# Patient Record
Sex: Female | Born: 1985 | Race: White | Hispanic: No | Smoking: Never smoker
Health system: Southern US, Community
[De-identification: ages and names within clinical notes are randomized; demographics above are authoritative.]

## PROBLEM LIST (undated history)

## (undated) DIAGNOSIS — J45909 Unspecified asthma, uncomplicated: Secondary | ICD-10-CM

## (undated) HISTORY — PX: APPENDECTOMY: SHX54

---

## 2009-02-18 ENCOUNTER — Ambulatory Visit: Payer: Self-pay | Admitting: Family Medicine

## 2011-01-22 ENCOUNTER — Ambulatory Visit: Payer: Self-pay | Admitting: Family Medicine

## 2011-04-18 ENCOUNTER — Ambulatory Visit: Payer: Self-pay | Admitting: Surgery

## 2011-04-20 LAB — PATHOLOGY REPORT

## 2013-02-01 IMAGING — CT CT ABD-PELV W/ CM
1 of 2 series · 15 of 32 positions shown, 19 images · non-contrast
Comparison: none

REASON FOR EXAM: (1) N/V; (2) RLQ PAIN - EVAL APPENDIX;    NOTE: Nursing
to Give Oral CT Contrast
COMMENTS:   May transport without cardiac monitor

PROCEDURE:     CT  - CT ABDOMEN / PELVIS  W  - April 18, 2011  [DATE]
RESULT:
TECHNIQUE: Helical 3 mm sections were obtained from the lung base through
the pubic symphysis status post intravenous administration of 100 mL
Dsovue-BR7 and oral contrast.

[Series 2: appendicitis · axial · 0.67mm/px · z∈[-683,-302]mm · 15 of 139 slices shown, 19 images]
[im 6/139  soft-tissue]
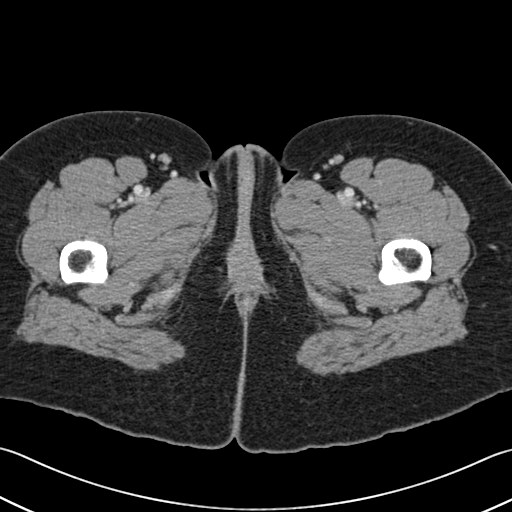
[im 6/139  bone]
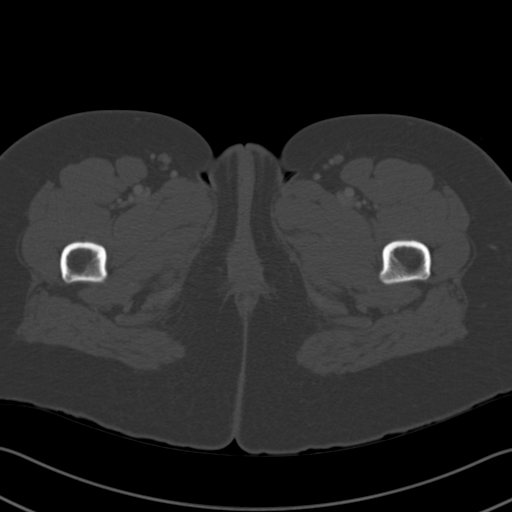
[im 17/139  soft-tissue]
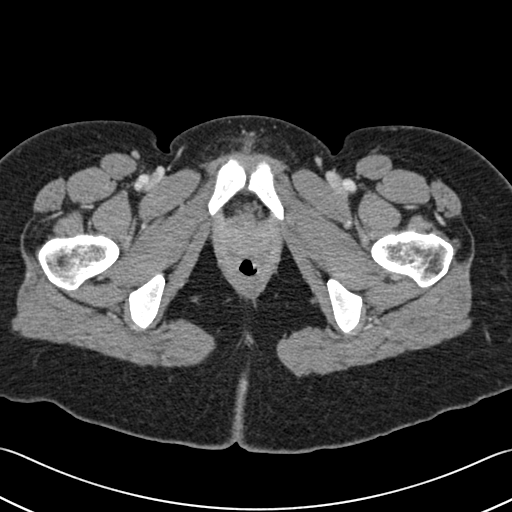
[im 28/139  soft-tissue]
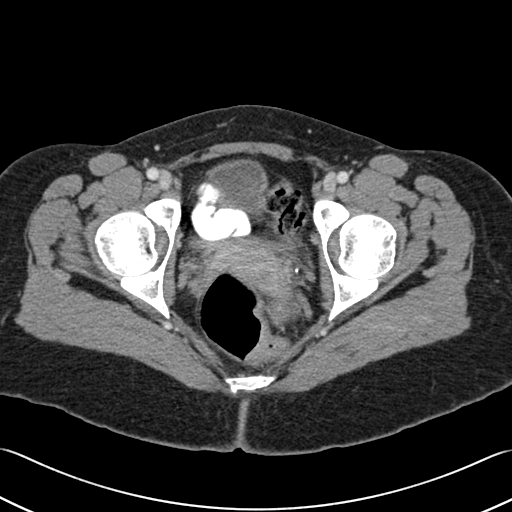
[im 39/139  soft-tissue]
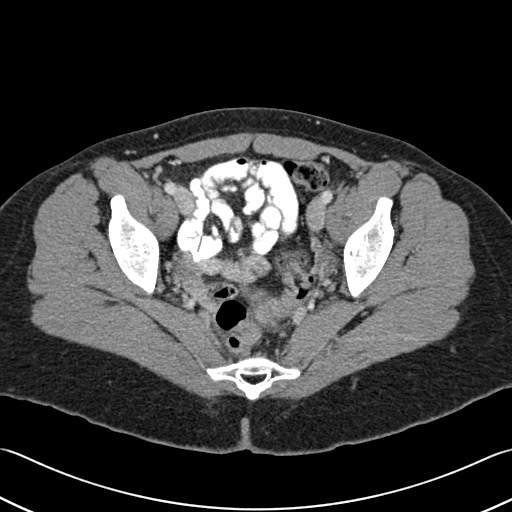
[im 50/139  soft-tissue]
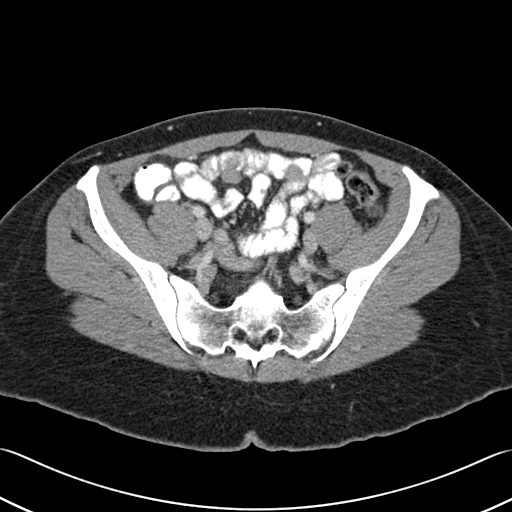
[im 61/139  soft-tissue]
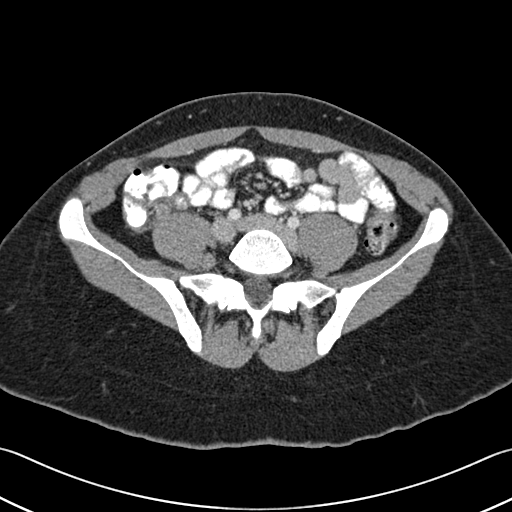
[im 72/139  soft-tissue]
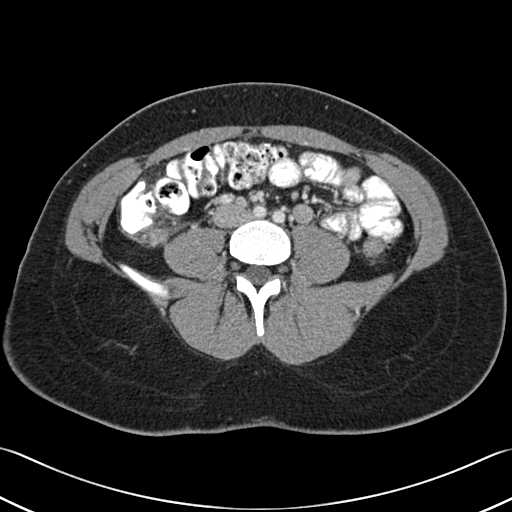
[im 78/139  soft-tissue]
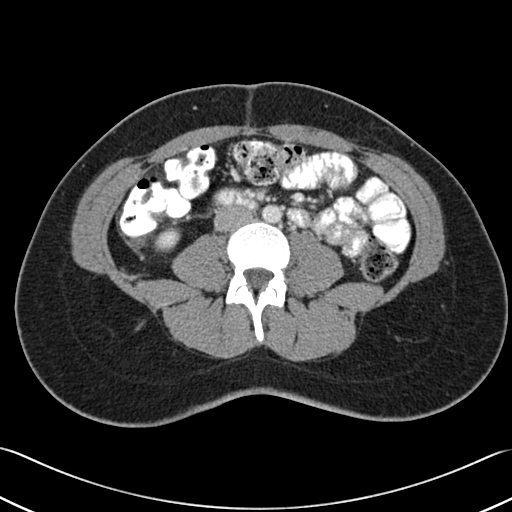
[im 89/139  soft-tissue]
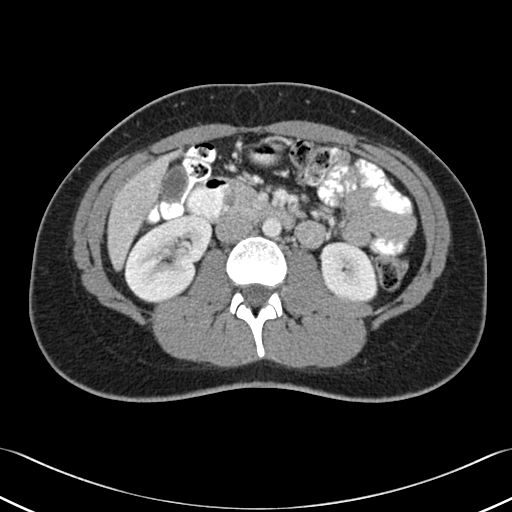
[im 89/139  bone]
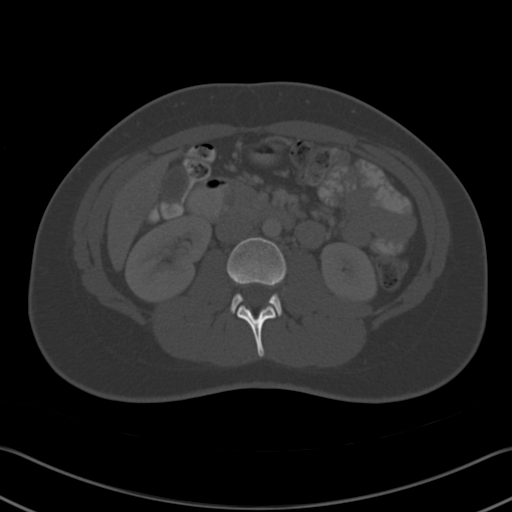
[im 100/139  soft-tissue]
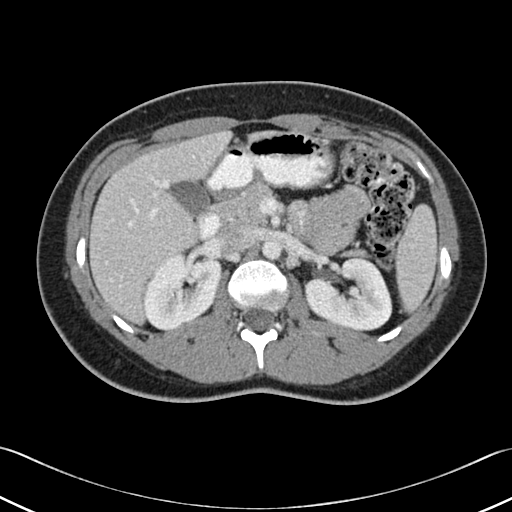
[im 111/139  soft-tissue]
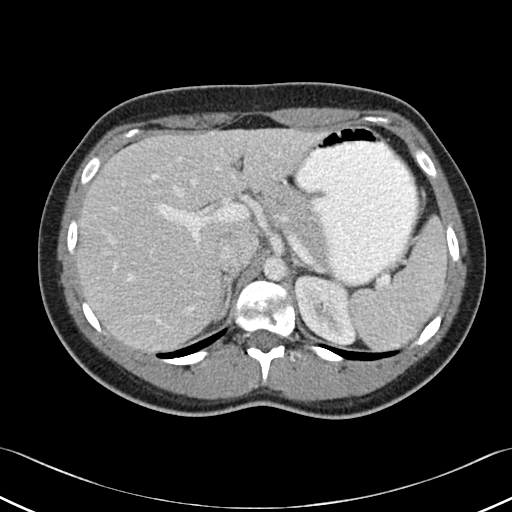
[im 116/139  lung]
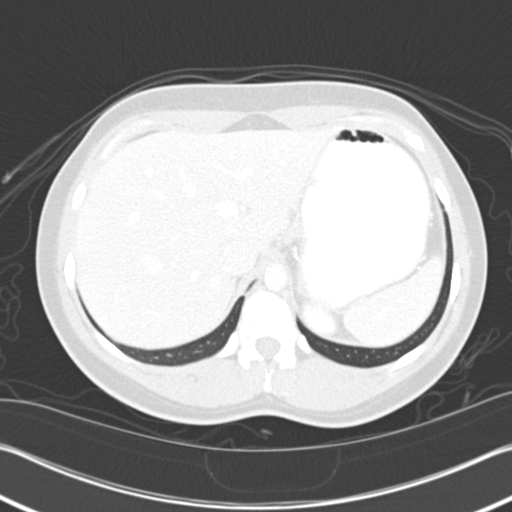
[im 122/139  soft-tissue]
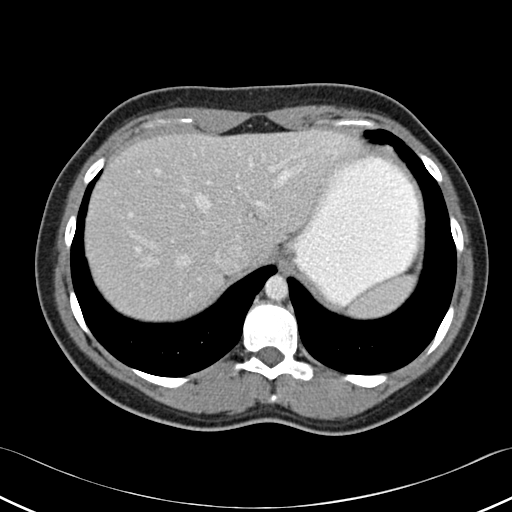
[im 122/139  lung]
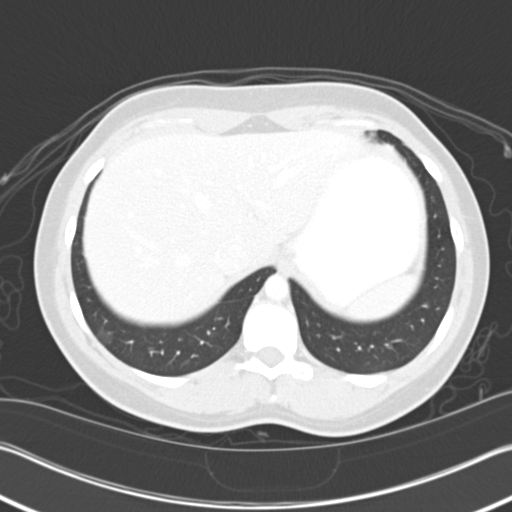
[im 127/139  lung]
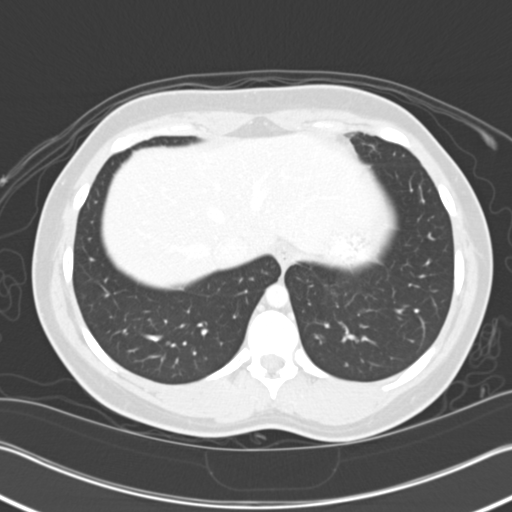
[im 133/139  soft-tissue]
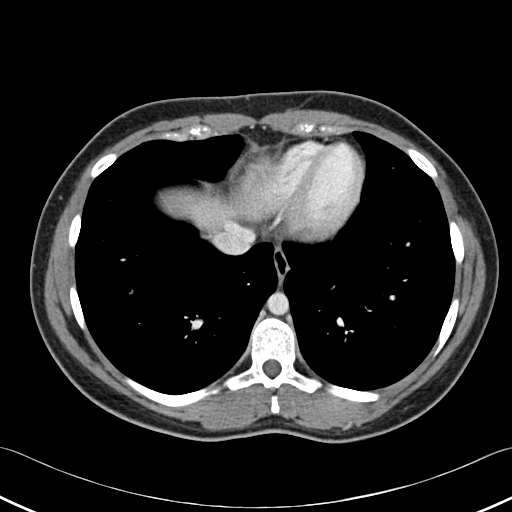
[im 133/139  lung]
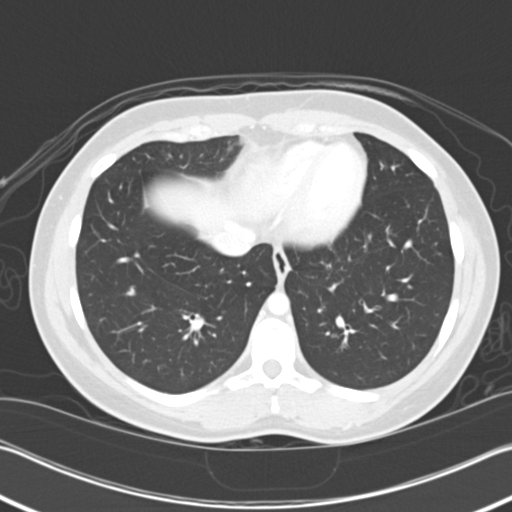

[15 of 32 positions shown; findings below may reference images not displayed]

FINDINGS: The lung bases are unremarkable.

The liver, spleen, adrenals, pancreas and kidneys are unremarkable.

The appendix is identified and is in a retrocecal location resting on the
psoas musculature. The appendix is thick-walled and distended. There are
inflammatory changes within the periappendiceal fat. The appendix measures
1.17 cm in diameter. There is no evidence of associated loculated fluid
collections consistent with an abscess or free air.

There is no evidence of bowel obstruction or evidence of an abdominal aortic
aneurysm.
IMPRESSION: 1. Findings consistent with appendicitis.
2. Dr. Hovis of the Emergency Department was informed of these findings via a
preliminary faxed report dated 04/18/2011 at [DATE] a.m. Eastern standard time.

## 2017-03-29 ENCOUNTER — Other Ambulatory Visit: Payer: Self-pay

## 2017-03-29 ENCOUNTER — Ambulatory Visit: Admission: EM | Admit: 2017-03-29 | Discharge: 2017-03-29 | Discharge status: Absent without leave | Payer: Self-pay

## 2017-03-29 ENCOUNTER — Ambulatory Visit
Admission: EM | Admit: 2017-03-29 | Discharge: 2017-03-29 | Disposition: A | Discharge status: Home / Self Care | Payer: Managed Care, Other (non HMO) | Attending: Family Medicine | Admitting: Family Medicine

## 2017-03-29 DIAGNOSIS — K529 Noninfective gastroenteritis and colitis, unspecified: Secondary | ICD-10-CM

## 2017-03-29 HISTORY — DX: Unspecified asthma, uncomplicated: J45.909

## 2017-03-29 MED ORDER — ONDANSETRON 8 MG PO TBDP: 8.0000 mg | ORAL_TABLET | Freq: Three times a day (TID) | ORAL | 0 refills | Status: AC | PRN

## 2017-03-29 MED ORDER — ONDANSETRON 8 MG PO TBDP
8.0000 mg | ORAL_TABLET | Freq: Once | ORAL | Status: AC
  Administered 2017-03-29: 8 mg via ORAL

## 2017-03-29 NOTE — ED Provider Notes (Signed)
[facility]    CSN: [id number] Arrival date & time: 03/29/17  1321     History   Chief Complaint Chief Complaint  Patient presents with  . Emesis    HPI Wanda Tran is a 31 y.o. female.   31 yo female with a c/o 2 days of intermittent vomiting and diarrhea. Denies any fevers, chills, abdominal pain, melena, hematochezia, hematemesis, dysuria. Reports positive sick contacts with similar. Denies pregnancy as she reports not being sexually active.    The history is provided by the patient.    Past Medical History:  Diagnosis Date  . Asthma     There are no active problems to display for this patient.   Past Surgical History:  Procedure Laterality Date  . APPENDECTOMY      OB History    No data available       Home Medications    Prior to Admission medications   Medication Sig Start Date End Date Taking? Authorizing Provider  albuterol (PROVENTIL HFA;VENTOLIN HFA) 108 (90 Base) MCG/ACT inhaler Inhale 2 puffs into the lungs every 6 (six) hours as needed for wheezing or shortness of breath.   Yes [provider]  fexofenadine (ALLEGRA) 180 MG tablet Take 180 mg by mouth daily.   Yes [provider]  montelukast (SINGULAIR) 10 MG tablet Take 10 mg by mouth at bedtime.   Yes [provider]  ondansetron (ZOFRAN ODT) 8 MG disintegrating tablet Take 1 tablet (8 mg total) by mouth every 8 (eight) hours as needed. 03/29/17   [name], Maycol Hoying, MD    Family History Family History  Problem Relation Age of Onset  . Asthma Father   . Allergic rhinitis Father     Social History Social History   Tobacco Use  . Smoking status: Never Smoker  . Smokeless tobacco: Never Used  Substance Use Topics  . Alcohol use: Yes    Comment: occasionally  . Drug use: No     Allergies   Benadryl [diphenhydramine hcl]   Review of Systems Review of Systems   Physical Exam Triage Vital Signs ED Triage Vitals  Enc Vitals Group    BP 03/29/17 1350 114/70     Pulse Rate 03/29/17 1350 84     Resp 03/29/17 1350 18     Temp 03/29/17 1350 98 F (36.7 C)     Temp Source 03/29/17 1350 Oral     SpO2 03/29/17 1350 100 %     Weight 03/29/17 1347 170 lb (77.1 kg)     Height 03/29/17 1347 5\' 1"  (1.549 m)     Head Circumference --      Peak Flow --      Pain Score 03/29/17 1347 0     Pain Loc --      Pain Edu? --      Excl. in GC? --    No data found.  Updated Vital Signs BP 114/70 (BP Location: Left Arm)   Pulse 84   Temp 98 F (36.7 C) (Oral)   Resp 18   Ht 5\' 1"  (1.549 m)   Wt 170 lb (77.1 kg)   LMP 03/19/2017   SpO2 100%   BMI 32.12 kg/m   Visual Acuity Right Eye Distance:   Left Eye Distance:   Bilateral Distance:    Right Eye Near:   Left Eye Near:    Bilateral Near:     Physical Exam  Constitutional: She appears well-developed and well-nourished. No distress.  Abdominal: Soft. Bowel sounds are normal. She exhibits no distension and no mass. There is no tenderness. There is no rebound and no guarding.  Skin: She is not diaphoretic.  Nursing note and vitals reviewed.    UC Treatments / Results  Labs (all labs ordered are listed, but only abnormal results are displayed) Labs Reviewed - No data to display  EKG  EKG Interpretation None       Radiology No results found.  Procedures Procedures (including critical care time)  Medications Ordered in UC Medications  ondansetron (ZOFRAN-ODT) disintegrating tablet 8 mg (8 mg Oral Given 03/29/17 1422)     Initial Impression / Assessment and Plan / UC Course  I have reviewed the triage vital signs and the nursing notes.  Pertinent labs & imaging results that were available during my care of the patient were reviewed by me and considered in my medical decision making (see chart for details).       Final Clinical Impressions(s) / UC Diagnoses   Final diagnoses:  Gastroenteritis    ED Discharge Orders        Ordered     ondansetron (ZOFRAN ODT) 8 MG disintegrating tablet  Every 8 hours PRN     03/29/17 1430     1. diagnosis reviewed with patient 2. Patient given zofran 8mg  odt x 1  3.  rx as per orders above; reviewed possible side effects, interactions, risks and benefits  4. Recommend supportive treatment with clear liquid diet then advance slowly as tolerated 5. Follow-up prn if symptoms worsen or don't improve  Controlled Substance Prescriptions [place] Controlled Substance Registry consulted? Not Applicable   [name], Lijah Bourque, MD 03/29/17 1435

## 2017-03-29 NOTE — ED Triage Notes (Signed)
Patient complains of nausea, vomiting, diarrhea with headaches. Patient states that symptoms started on Sunday. Reports that she had some headaches over last week.

## 2017-04-11 ENCOUNTER — Emergency Department
Admission: EM | Admit: 2017-04-11 | Discharge: 2017-04-11 | Disposition: A | Discharge status: Home / Self Care | Payer: Managed Care, Other (non HMO) | Attending: Emergency Medicine | Admitting: Emergency Medicine

## 2017-04-11 ENCOUNTER — Encounter: Payer: Self-pay | Admitting: Emergency Medicine

## 2017-04-11 ENCOUNTER — Emergency Department: Payer: Managed Care, Other (non HMO)

## 2017-04-11 DIAGNOSIS — J45909 Unspecified asthma, uncomplicated: Secondary | ICD-10-CM | POA: Diagnosis not present

## 2017-04-11 DIAGNOSIS — Y999 Unspecified external cause status: Secondary | ICD-10-CM | POA: Insufficient documentation

## 2017-04-11 DIAGNOSIS — S99922A Unspecified injury of left foot, initial encounter: Secondary | ICD-10-CM | POA: Diagnosis present

## 2017-04-11 DIAGNOSIS — Y939 Activity, unspecified: Secondary | ICD-10-CM | POA: Insufficient documentation

## 2017-04-11 DIAGNOSIS — Y929 Unspecified place or not applicable: Secondary | ICD-10-CM | POA: Insufficient documentation

## 2017-04-11 DIAGNOSIS — X501XXA Overexertion from prolonged static or awkward postures, initial encounter: Secondary | ICD-10-CM | POA: Diagnosis not present

## 2017-04-11 DIAGNOSIS — R55 Syncope and collapse: Secondary | ICD-10-CM

## 2017-04-11 DIAGNOSIS — S93602A Unspecified sprain of left foot, initial encounter: Secondary | ICD-10-CM | POA: Diagnosis not present

## 2017-04-11 MED ORDER — IBUPROFEN 800 MG PO TABS: 800.0000 mg | ORAL_TABLET | Freq: Three times a day (TID) | ORAL | 0 refills | Status: AC | PRN

## 2017-04-11 MED ORDER — IBUPROFEN 800 MG PO TABS
800.0000 mg | ORAL_TABLET | Freq: Once | ORAL | Status: AC
  Administered 2017-04-11: 800 mg via ORAL

## 2017-04-11 MED ORDER — IBUPROFEN 800 MG PO TABS
ORAL_TABLET | ORAL | Status: AC
  Administered 2017-04-11: 800 mg via ORAL
  Filled 2017-04-11: qty 1

## 2017-04-11 NOTE — ED Provider Notes (Signed)
[facility] Emergency Department Provider Note       Time seen: ----------------------------------------- 5:34 PM on 04/11/2017 -----------------------------------------   I have reviewed the triage vital signs and the nursing notes.  HISTORY   Chief Complaint Foot Pain    HPI Wanda Tran is a 31 y.o. female with a history of asthma who presents to the ED for left foot pain.  Patient arrives via EMS from a friend's house.  She reports sitting on her foot causing it to go numb and then when she got up she felt a pop and heard a pop in her left foot.  Because of the pain she had a near syncopal event.  She is currently complaining of left foot pain although it has improved in route.  Past Medical History:  Diagnosis Date  . Asthma     There are no active problems to display for this patient.   Past Surgical History:  Procedure Laterality Date  . APPENDECTOMY      Allergies Benadryl [diphenhydramine hcl]  Social History Social History   Tobacco Use  . Smoking status: Never Smoker  . Smokeless tobacco: Never Used  Substance Use Topics  . Alcohol use: Yes    Comment: occasionally  . Drug use: No    Review of Systems Constitutional: Negative for fever. Eyes: Negative for vision changes ENT:  Negative for congestion, sore throat Cardiovascular: Negative for chest pain. Respiratory: Negative for shortness of breath. Gastrointestinal: Negative for abdominal pain, vomiting and diarrhea. Musculoskeletal: Positive for left foot pain Skin: Negative for rash. Neurological: Negative for headaches, focal weakness or numbness.  All systems negative/normal/unremarkable except as stated in the HPI  ____________________________________________   PHYSICAL EXAM:  VITAL SIGNS: ED Triage Vitals [04/11/17 1720]  Enc Vitals Group     BP 120/80     Pulse Rate 71     Resp 16     Temp 98.4 F (36.9 C)     Temp Source Oral     SpO2 99 %   Weight 170 lb (77.1 kg)     Height 5\' 1"  (1.549 m)     Head Circumference      Peak Flow      Pain Score 6     Pain Loc      Pain Edu?      Excl. in GC?    Constitutional: Alert and oriented. Well appearing and in no distress. Eyes: Conjunctivae are normal. Normal extraocular movements. Cardiovascular: Normal rate, regular rhythm. No murmurs, rubs, or gallops. Respiratory: Normal respiratory effort without tachypnea nor retractions. Breath sounds are clear and equal bilaterally. No wheezes/rales/rhonchi. Gastrointestinal: Soft and nontender. Normal bowel sounds Musculoskeletal: Minimal tenderness to the dorsum of the left foot.  Good pulses Neurologic:  Normal speech and language. No gross focal neurologic deficits are appreciated.  Skin:  Skin is warm, dry and intact. No rash noted. Psychiatric: Mood and affect are normal. Speech and behavior are normal.  ____________________________________________  EKG: Interpreted by me.  Sinus rhythm rate 85 bpm, normal PR interval, normal QRS, normal QT.  ____________________________________________  ED COURSE:  Pertinent labs & imaging results that were available during my care of the patient were reviewed by me and considered in my medical decision making (see chart for details). Patient presents for left foot pain, we will assess with imaging as indicated.   Procedures ____________________________________________   RADIOLOGY  Left foot x-rays  IMPRESSION: Negative. ____________________________________________  DIFFERENTIAL DIAGNOSIS Foot sprain, contusion, fracture, vasovagal  syncope, arrhythmia    FINAL ASSESSMENT AND PLAN  Foot sprain   Plan: Patient had presented for left foot pain and possible syncope.  She has a normal examination and does not seem to warrant further workup.  X-rays are negative.  I will encourage Ace wrap and crutches with NSAIDs as needed.   Emily FilbertWilliams, Stiles Maxcy E, MD   Note: This note was  generated in part or whole with voice recognition software. Voice recognition is usually quite accurate but there are transcription errors that can and very often do occur. I apologize for any typographical errors that were not detected and corrected.     Emily FilbertWilliams, Jordanny Waddington E, MD 04/11/17 [id number]

## 2017-04-11 NOTE — ED Triage Notes (Signed)
Pt in via [facility] from a friends house, reports sitting on foot causing it to go numb, when she got up, "I heard a pop" reports significant pain w/ possible syncopal episode.  Pt AxO/4, vitals WDL, NAD noted at this time.

## 2019-01-26 IMAGING — DX DG FOOT COMPLETE 3+V*L*
3 series · 3 of 3 positions shown · non-contrast
Comparison: None.

CLINICAL DATA: Left foot pain.  Trauma

EXAM:
LEFT FOOT - COMPLETE 3+ VIEW

[foot ap]
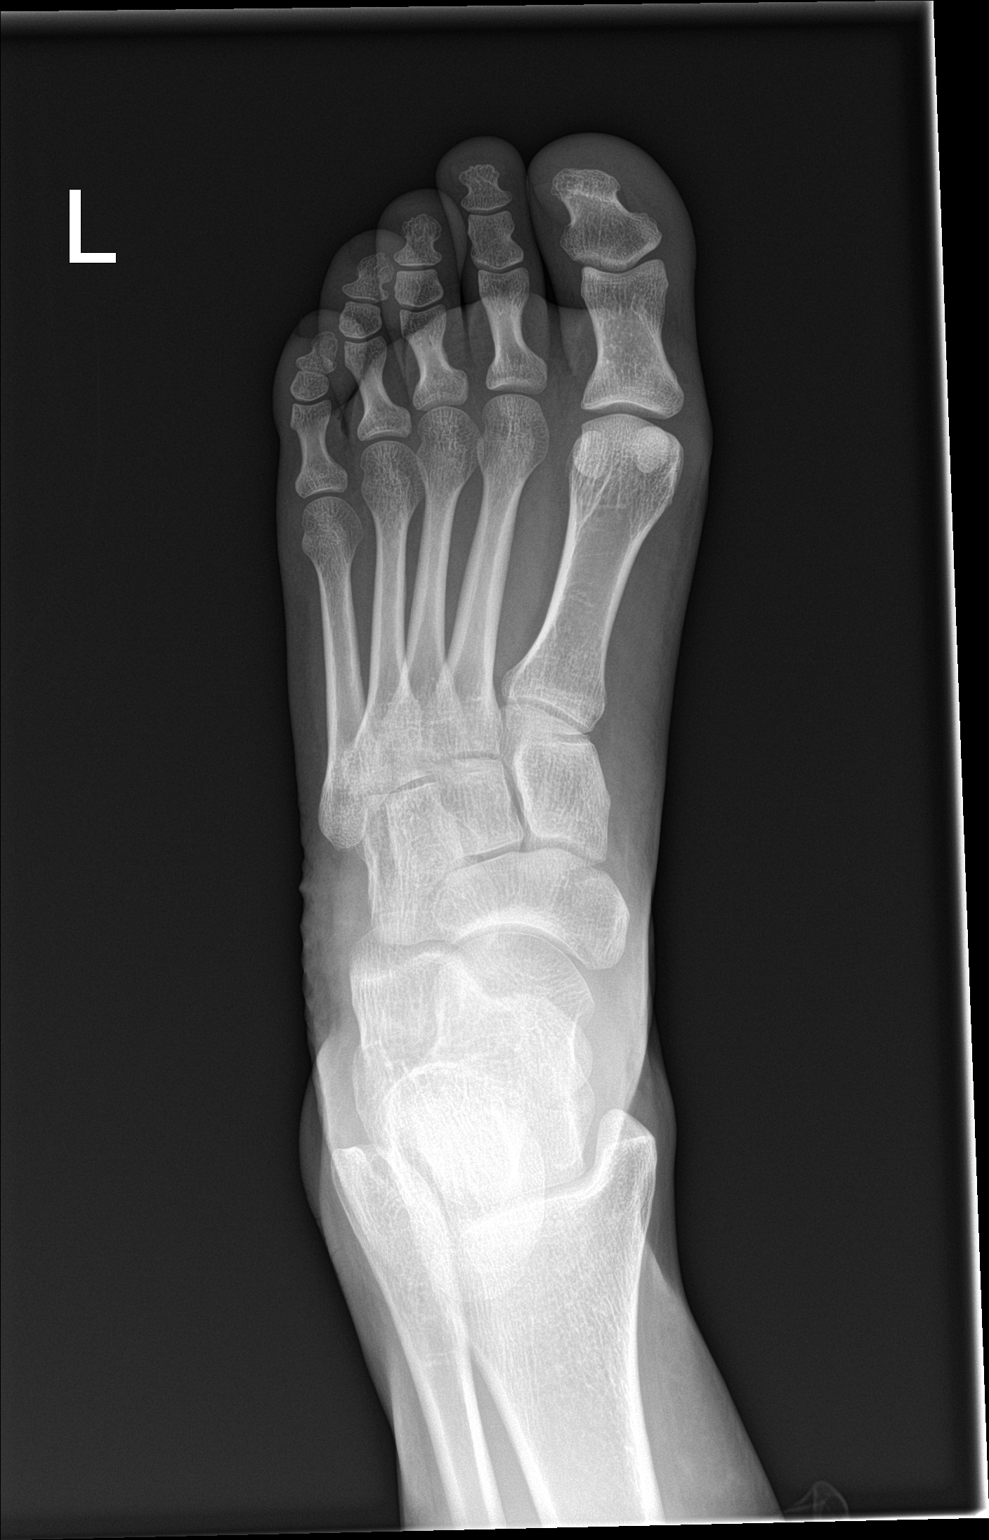

[foot obl]
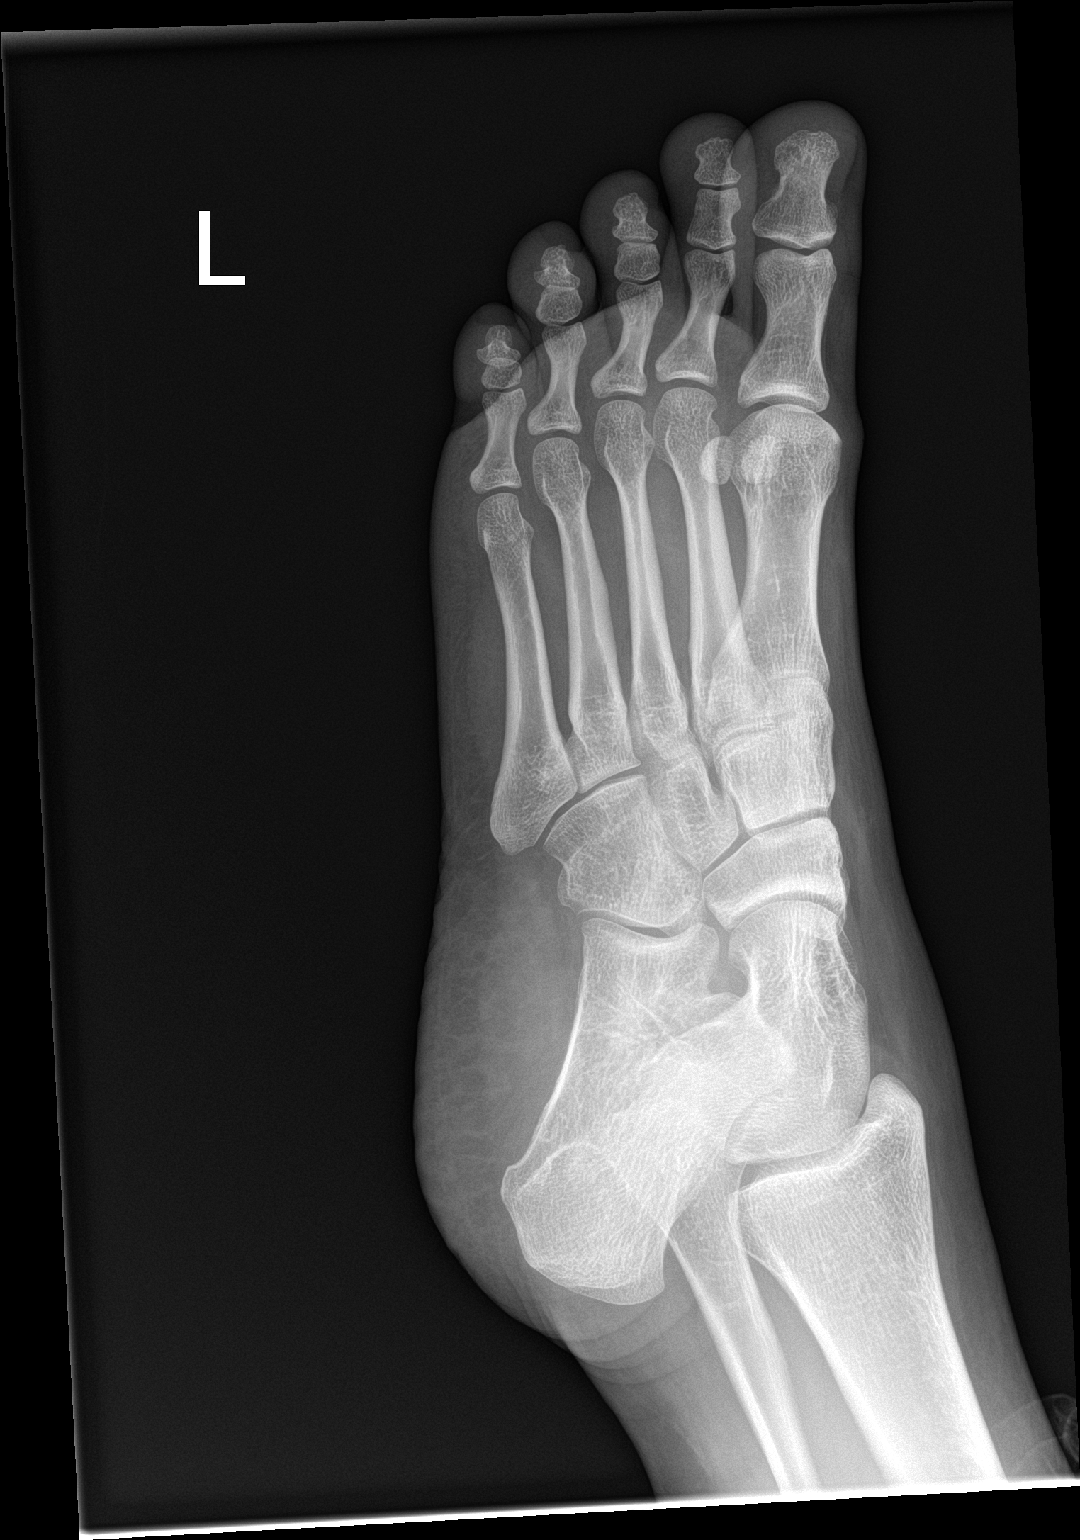

[foot lat]
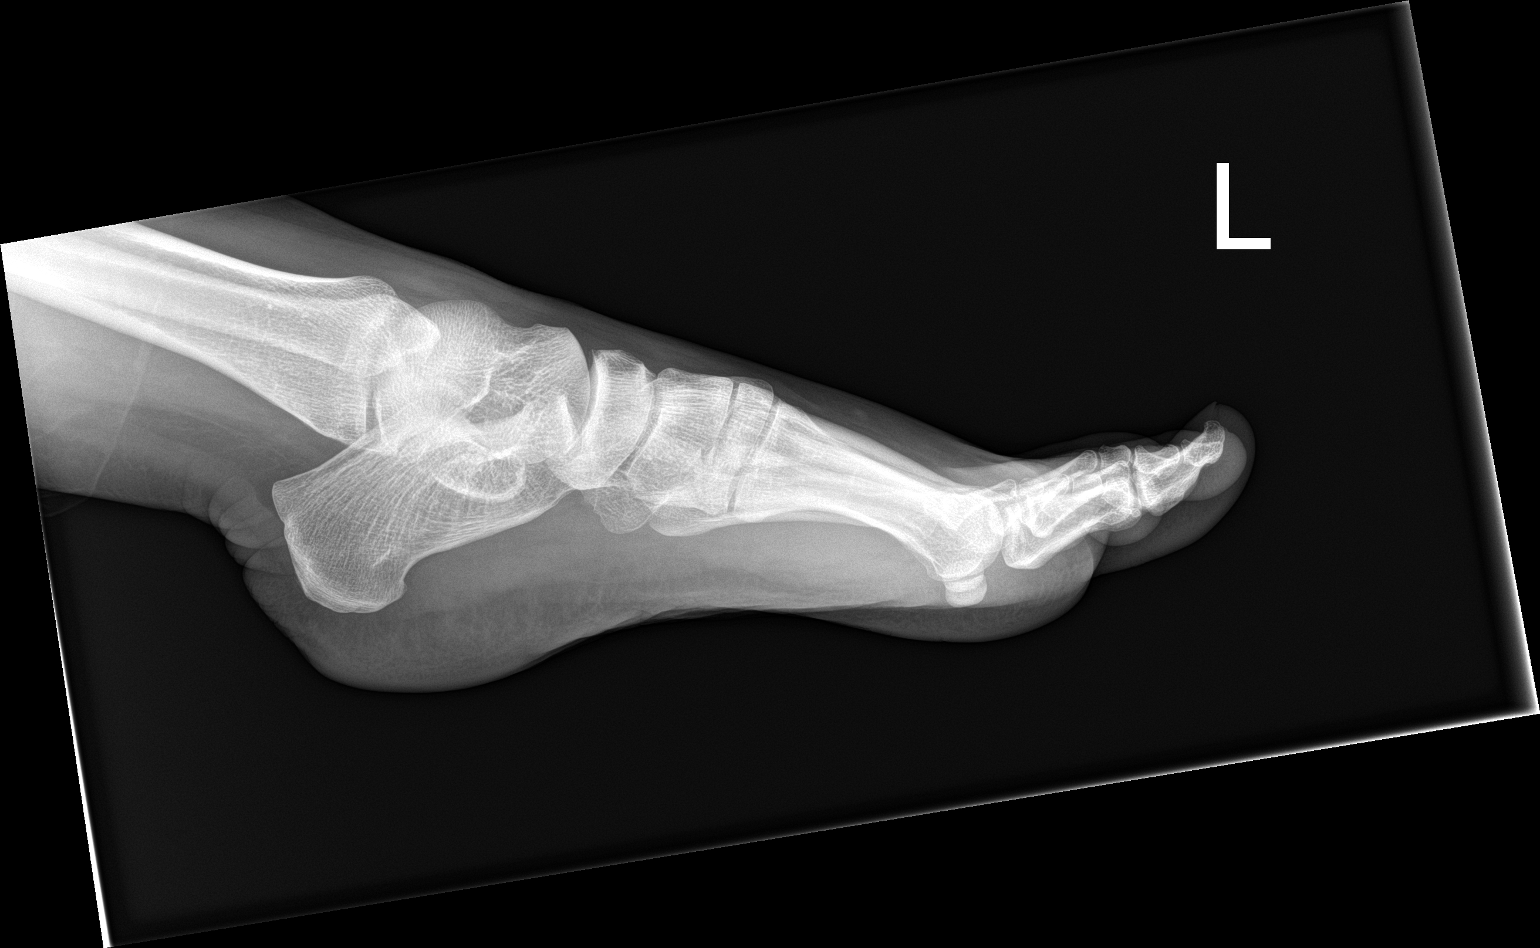

[3 of 3 positions shown; findings below may reference images not displayed]

FINDINGS: There is no evidence of fracture or dislocation. There is no
evidence of arthropathy or other focal bone abnormality. Soft
tissues are unremarkable.
IMPRESSION: Negative.

## 2023-03-07 ENCOUNTER — Encounter: Payer: Self-pay | Admitting: Emergency Medicine
